# Patient Record
Sex: Female | Born: 1980 | Race: Black or African American | Hispanic: No | Marital: Single
Health system: Southern US, Community
[De-identification: ages and names within clinical notes are randomized; demographics above are authoritative.]

---

## 2009-06-20 ENCOUNTER — Emergency Department (HOSPITAL_COMMUNITY): Admission: EM | Admit: 2009-06-20 | Discharge: 2009-06-20 | Payer: Self-pay | Admitting: Emergency Medicine

## 2010-11-08 ENCOUNTER — Emergency Department (HOSPITAL_COMMUNITY)
Admission: EM | Admit: 2010-11-08 | Discharge: 2010-11-08 | Disposition: A | Discharge status: Home / Self Care | Payer: Self-pay | Attending: Emergency Medicine | Admitting: Emergency Medicine

## 2010-11-08 DIAGNOSIS — R05 Cough: Secondary | ICD-10-CM | POA: Insufficient documentation

## 2010-11-08 DIAGNOSIS — H9209 Otalgia, unspecified ear: Secondary | ICD-10-CM | POA: Insufficient documentation

## 2010-11-08 DIAGNOSIS — R059 Cough, unspecified: Secondary | ICD-10-CM | POA: Insufficient documentation

## 2010-11-08 DIAGNOSIS — J3489 Other specified disorders of nose and nasal sinuses: Secondary | ICD-10-CM | POA: Insufficient documentation

## 2010-11-08 DIAGNOSIS — F17200 Nicotine dependence, unspecified, uncomplicated: Secondary | ICD-10-CM | POA: Insufficient documentation

## 2010-11-08 DIAGNOSIS — R6883 Chills (without fever): Secondary | ICD-10-CM | POA: Insufficient documentation

## 2010-11-08 DIAGNOSIS — J069 Acute upper respiratory infection, unspecified: Secondary | ICD-10-CM | POA: Insufficient documentation

## 2010-11-08 DIAGNOSIS — J029 Acute pharyngitis, unspecified: Secondary | ICD-10-CM | POA: Insufficient documentation

## 2011-08-04 ENCOUNTER — Telehealth: Payer: Self-pay

## 2011-08-04 NOTE — Telephone Encounter (Signed)
Patient advised labs being pulled and we will call her when they are reviewed. Have you gotten them, her chart not in file. It is WC, I think maybe a culture.

## 2011-08-04 NOTE — Telephone Encounter (Signed)
Pt is calling to speak with chelle regarding lab results Please call pt at [phone]

## 2011-08-04 NOTE — Telephone Encounter (Signed)
Called patient, she was seen on 07/31/11 will pull chart, not in file, she is asking for results.

## 2011-08-04 NOTE — Telephone Encounter (Signed)
PT STATES SHE WAS SEEN FOR A W/C WITH CHELLE AND WOULD LIKE A CALL BACK REGARDING HER W/C VISIT PLEASE CALL [phone]

## 2011-08-05 NOTE — Telephone Encounter (Signed)
Patient was notified that the source patient tests were all negative yesterday by Benny Lennert, PA-C.  Because this is WC, it shouldn't be in the EMR-the OV is in the paper record.

## 2011-11-17 ENCOUNTER — Emergency Department (HOSPITAL_COMMUNITY)
Admission: EM | Admit: 2011-11-17 | Discharge: 2011-11-17 | Disposition: A | Discharge status: Home / Self Care | Payer: Self-pay | Attending: Emergency Medicine | Admitting: Emergency Medicine

## 2011-11-17 ENCOUNTER — Encounter (HOSPITAL_COMMUNITY): Payer: Self-pay | Admitting: *Deleted

## 2011-11-17 DIAGNOSIS — R51 Headache: Secondary | ICD-10-CM | POA: Insufficient documentation

## 2011-11-17 DIAGNOSIS — K0889 Other specified disorders of teeth and supporting structures: Secondary | ICD-10-CM

## 2011-11-17 DIAGNOSIS — K089 Disorder of teeth and supporting structures, unspecified: Secondary | ICD-10-CM | POA: Insufficient documentation

## 2011-11-17 DIAGNOSIS — H9209 Otalgia, unspecified ear: Secondary | ICD-10-CM | POA: Insufficient documentation

## 2011-11-17 MED ORDER — PENICILLIN V POTASSIUM 500 MG PO TABS: 500.0000 mg | ORAL_TABLET | Freq: Four times a day (QID) | ORAL | Status: AC

## 2011-11-17 MED ORDER — HYDROCODONE-ACETAMINOPHEN 5-500 MG PO TABS: 1.0000 | ORAL_TABLET | Freq: Four times a day (QID) | ORAL | Status: DC | PRN

## 2011-11-17 NOTE — ED Provider Notes (Signed)
History     CSN: [id number]  Arrival date & time 11/17/11  4098   First MD Initiated Contact with Patient 11/17/11 [phone]      No chief complaint on file.   (Consider location/radiation/quality/duration/timing/severity/associated sxs/prior treatment) HPI Comments: Roberta Chambers is a 31 y.o. Female who presents with complaint of a toothache. Pt states she first noted a tooth pain almost a year ago after her filling came out. States at that time, pain comes and goes. States last week, pain worsened and now constant. States swishing cool water and tramadol makes pain better, eating makes pain worse. Pt denies fever, chills, malaise. Does not have a dentist. Tried bc powder, tramadol, Listerine mouth wash with no relief.     History reviewed. No pertinent past medical history.  History reviewed. No pertinent past surgical history.  No family history on file.  History  Substance Use Topics  . Smoking status: Not on file  . Smokeless tobacco: Not on file  . Alcohol Use: Not on file    OB History    Grav Para Term Preterm Abortions TAB SAB Ect Mult Living                  Review of Systems  Constitutional: Negative for fever and chills.  HENT: Positive for ear pain and dental problem. Negative for sore throat and facial swelling.   Respiratory: Negative.   Cardiovascular: Negative.   Skin: Negative for rash.  Neurological: Positive for headaches.    Allergies  Review of patient's allergies indicates no known allergies.  Home Medications   Current Outpatient Rx  Name  Route  Sig  Dispense  Refill  . BC HEADACHE PO   Oral   Take 1 packet by mouth every 8 (eight) hours as needed. For pain         . HYDROGEN PEROXIDE 1.5 % MT SOLN   Topical   Apply 1 spray topically as needed. For pain         . ADULT MULTIVITAMIN W/MINERALS CH   Oral   Take 1 tablet by mouth daily.         . SERTRALINE HCL 50 MG PO TABS   Oral   Take 50 mg by mouth daily as needed. For  depression         . TRAMADOL HCL 50 MG PO TABS   Oral   Take 50 mg by mouth every 6 (six) hours as needed. For pain           There were no vitals taken for this visit.  Physical Exam  Nursing note and vitals reviewed. Constitutional: She appears well-developed and well-nourished. No distress.  HENT:       No facial swelling. External ear, ear canal, and TMs normal bilaterally. Normal pharynx. There is a carries in the right upper 1st bicuspid tooth. No surrounding erythema or gum swelling. No obvious abscess. Tooth tender to palpation.   Lymphadenopathy:    She has no cervical adenopathy.  Neurological: She is alert.  Skin: Skin is warm and dry.    ED Course  Procedures (including critical care time)  Pt with dental carries, dental pain. Will cover for possible early abscess with antibiotic. Pain medication. Follow up with a dentist. Pt otherwise non toxic. No facial swelling.    1. Pain, dental       MDM          Lottie Mussel, PA 11/17/11 1532

## 2011-11-17 NOTE — ED Notes (Addendum)
Per Pt:  Pt complaining of a severe tooth ache for about 1 week.  Pt st's she's unable to eat without considerable pain, drinking water seems to help the pain.  Pt st's the right side of her face is swollen and painful.  Pt st's she was given a few antibiotics (doesnt' remember which ones) by a friend and st's they helped for a little bit.

## 2011-11-18 NOTE — ED Provider Notes (Signed)
Medical screening examination/treatment/procedure(s) were performed by non-physician practitioner and as supervising physician I was immediately available for consultation/collaboration.   Shelda Jakes, MD 11/18/11 [phone]

## 2015-08-07 DIAGNOSIS — G8929 Other chronic pain: Secondary | ICD-10-CM | POA: Insufficient documentation

## 2015-08-07 DIAGNOSIS — F419 Anxiety disorder, unspecified: Secondary | ICD-10-CM | POA: Insufficient documentation

## 2015-08-07 DIAGNOSIS — F331 Major depressive disorder, recurrent, moderate: Secondary | ICD-10-CM | POA: Insufficient documentation

## 2015-08-07 DIAGNOSIS — M25561 Pain in right knee: Secondary | ICD-10-CM

## 2016-02-19 ENCOUNTER — Other Ambulatory Visit: Payer: Self-pay | Admitting: Orthopedic Surgery

## 2016-02-19 DIAGNOSIS — M25561 Pain in right knee: Secondary | ICD-10-CM

## 2016-02-24 ENCOUNTER — Ambulatory Visit
Admission: RE | Admit: 2016-02-24 | Discharge: 2016-02-24 | Disposition: A | Discharge status: Home / Self Care | Payer: BLUE CROSS/BLUE SHIELD | Source: Ambulatory Visit | Attending: Orthopedic Surgery | Admitting: Orthopedic Surgery

## 2016-02-24 DIAGNOSIS — M25561 Pain in right knee: Secondary | ICD-10-CM

## 2016-09-25 ENCOUNTER — Encounter (INDEPENDENT_AMBULATORY_CARE_PROVIDER_SITE_OTHER): Payer: BLUE CROSS/BLUE SHIELD | Admitting: Podiatry

## 2016-09-25 ENCOUNTER — Ambulatory Visit: Payer: BLUE CROSS/BLUE SHIELD

## 2016-09-25 DIAGNOSIS — M79672 Pain in left foot: Secondary | ICD-10-CM

## 2016-09-25 DIAGNOSIS — M79671 Pain in right foot: Secondary | ICD-10-CM

## 2016-09-25 NOTE — Progress Notes (Signed)
This encounter was created in error - please disregard.

## 2017-09-03 IMAGING — MR MR KNEE*R* W/O CM
5 series · 33 of 40 positions shown · non-contrast
Comparison: None.

CLINICAL DATA: 35-year-old with chronic knee pain for 3 years. No
acute injury or prior relevant surgery. Steroid injection 4 or 5
days prior.

EXAM:
MRI OF THE RIGHT KNEE WITHOUT CONTRAST
TECHNIQUE: Multiplanar, multisequence MR imaging of the knee was performed. No
intravenous contrast was administered.

[Series 9: PD fat-sat · axial · right · 3.0mm · 0.39mm/px · z∈[+6,+117]mm · 8 of 32 slices shown (1 of 3)]
[im 1/32]
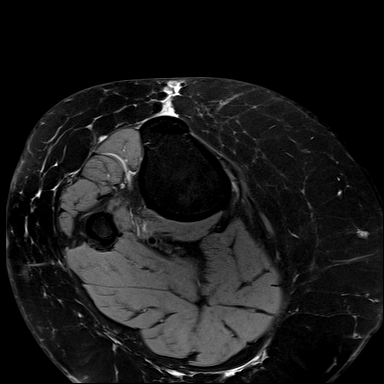
[im 4/32]
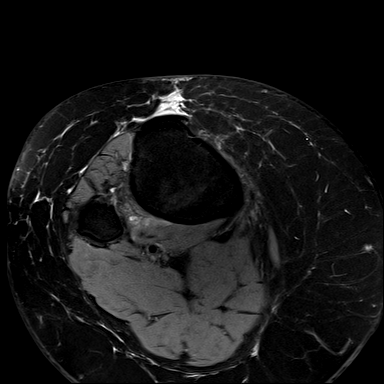
[im 11/32]
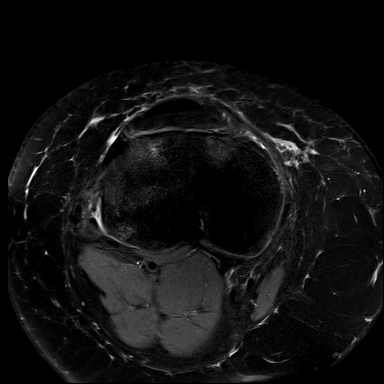
[im 14/32]
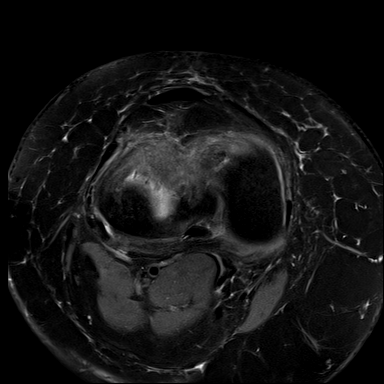
[im 18/32]
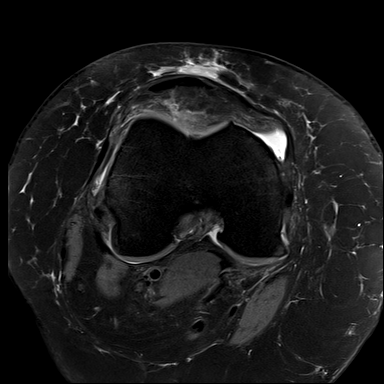
[im 21/32]
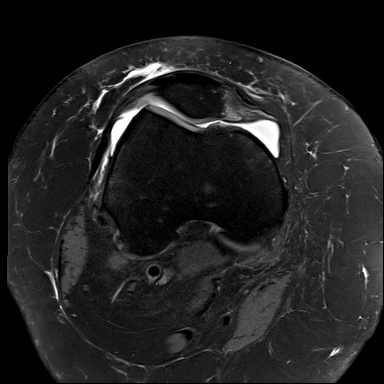
[im 28/32]
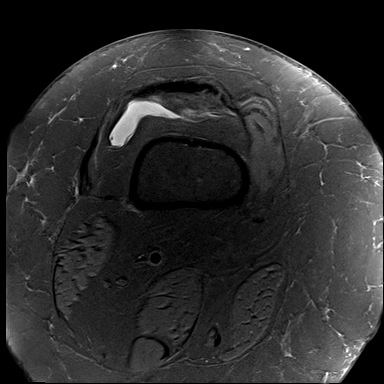
[im 32/32]
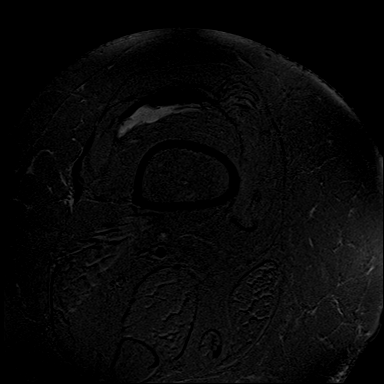

[Series 11: PD fat-sat · coronal · right · 3.5mm · 0.39mm/px · 7 of 20 slices shown (2 of 3)]
[im 1/20]
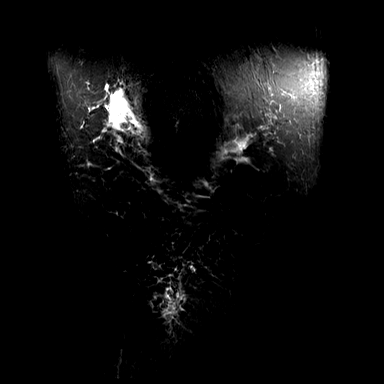
[im 4/20]
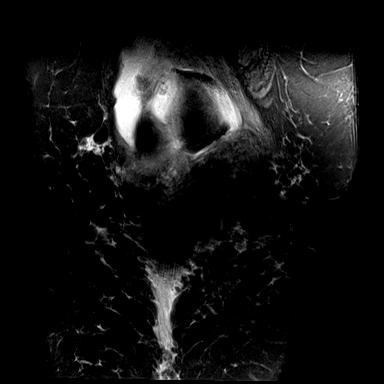
[im 7/20]
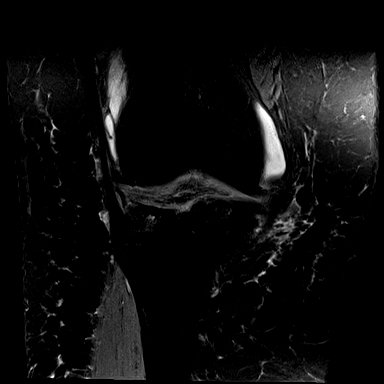
[im 10/20]
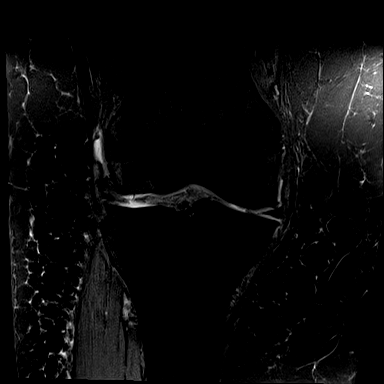
[im 13/20]
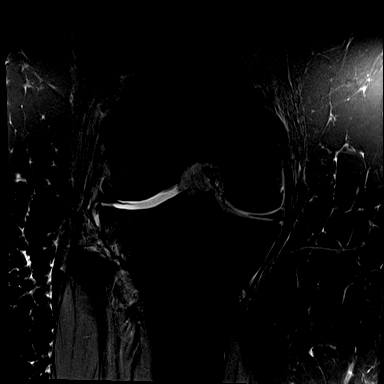
[im 16/20]
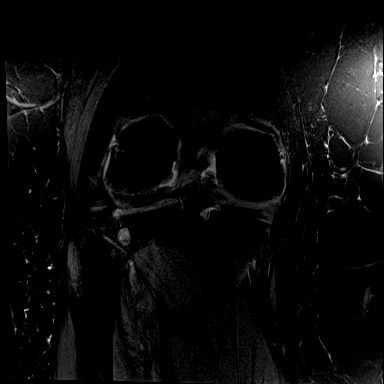
[im 20/20]
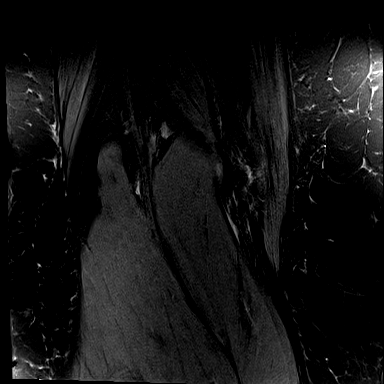

[Series 12: t1_tse_cor · coronal · right · 3.5mm · 0.39mm/px · 2 of 20 slices shown]
[im 1/20]
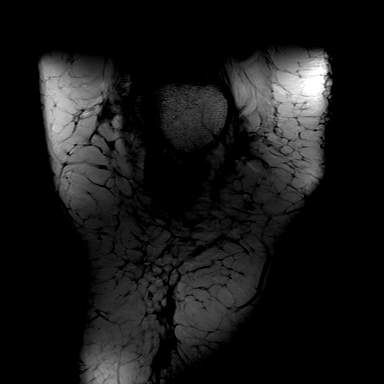
[im 4/20]
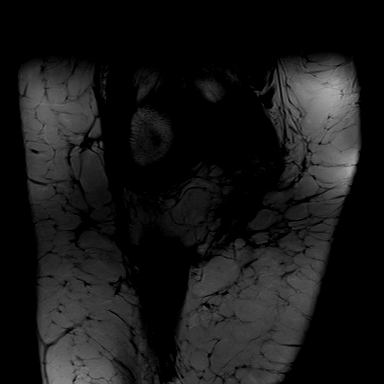

[Series 13: T2 fat-sat · coronal · right · 3.5mm · 0.39mm/px · 7 of 20 slices shown]
[im 1/20]
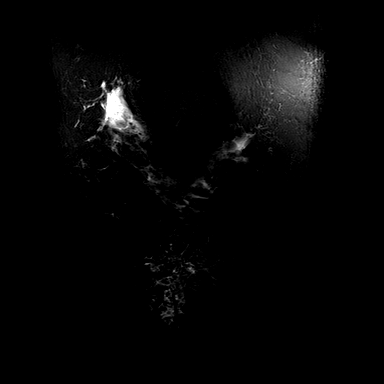
[im 4/20]
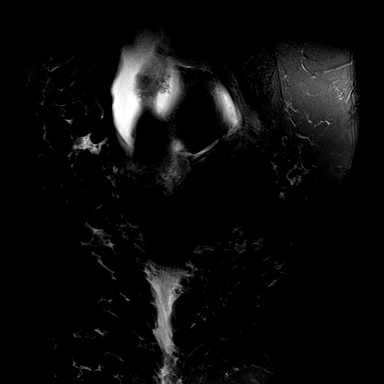
[im 7/20]
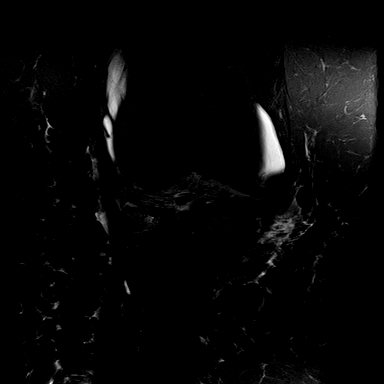
[im 10/20]
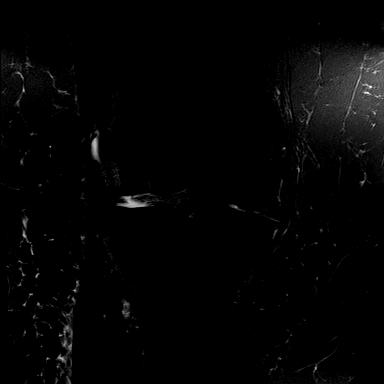
[im 13/20]
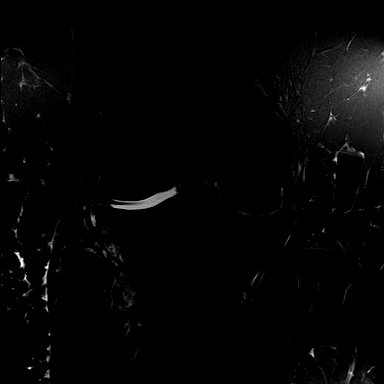
[im 16/20]
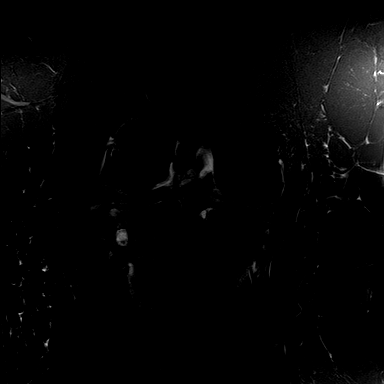
[im 20/20]
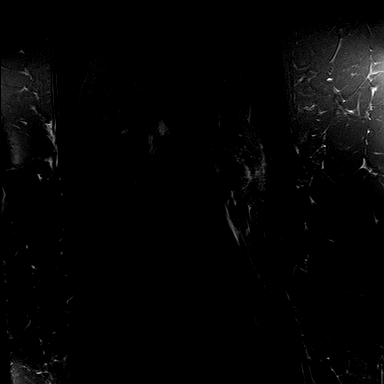

[Series 14: PD fat-sat · sagittal · right · 3.0mm · 0.39mm/px · 9 of 26 slices shown (3 of 3)]
[im 1/26]
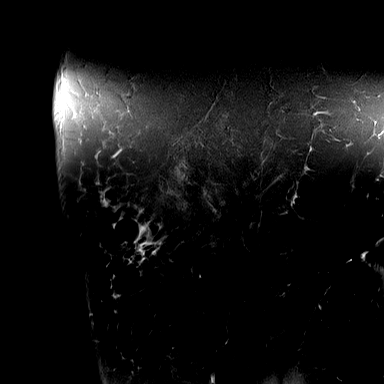
[im 4/26]
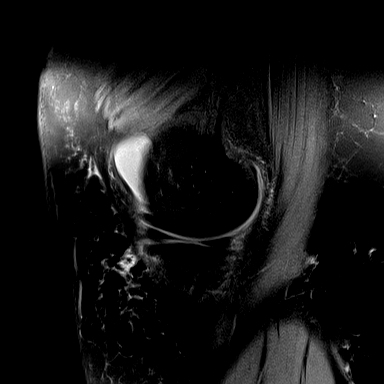
[im 7/26]
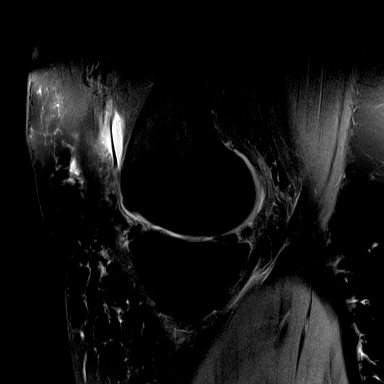
[im 10/26]
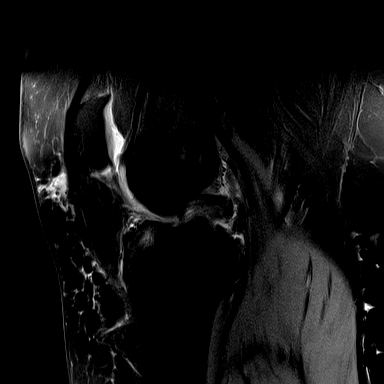
[im 13/26]
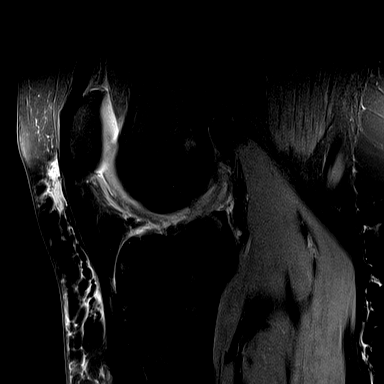
[im 16/26]
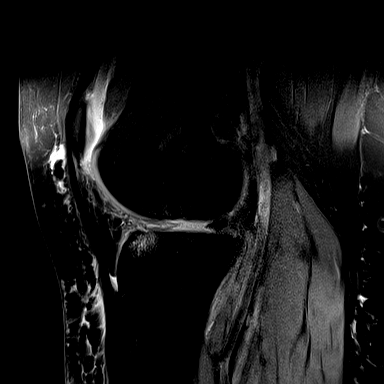
[im 19/26]
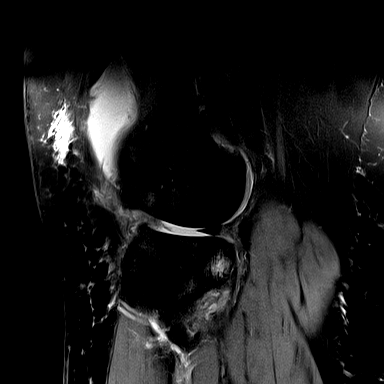
[im 22/26]
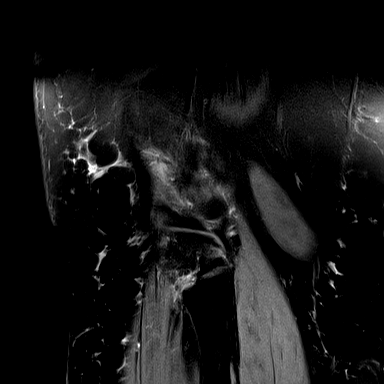
[im 26/26]
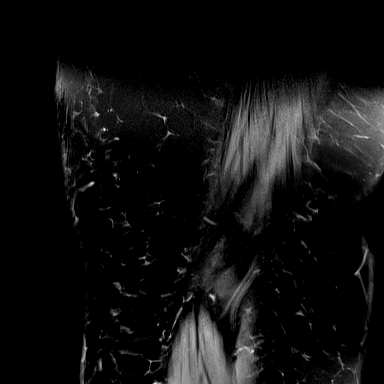

[33 of 40 positions shown; findings below may reference images not displayed]

FINDINGS: MENISCI

Medial meniscus: Intact with normal morphology. There is a amorphous
T2 hypointensity posterior to the posterior horn, best seen on the
axial and sagittal images which may reflect focal synovitis.

Lateral meniscus: Diffusely degenerated with irregular degenerative
tearing of the anterior horn and body. No displaced meniscal
fragment.

LIGAMENTS

Cruciates:  Intact.  There is moderate ACL mucoid degeneration.

Collaterals:  Intact.

CARTILAGE

Patellofemoral: The patellar cartilage is well preserved. There is
mild lateral trochlear chondromalacia.

Medial: Mild chondral thinning and surface irregularity. There are
small marginal osteophytes and mild subchondral cyst formation
anteriorly in the tibial plateau.

Lateral: Age advanced chondral thinning with osteophytes and
subchondral cyst formation.

MISCELLANEOUS

Joint: Moderate-sized joint effusion. As above, possible focal
synovitis posterior to the posterior horn of the medial meniscus. No
other loose bodies observed.

Popliteal Fossa:  Unremarkable. No significant Baker's cyst.

Extensor Mechanism:  Intact.

Bones: No acute osseous findings are seen. There are degenerative
changes of the proximal tibiofibular articulation with a subchondral
cyst or intraosseous ganglion posterolaterally in the tibial
plateau.

Other: Mild prepatellar subcutaneous edema and a small amount of
fluid in the deep infrapatellar bursa are noted. There is a mildly
prominent medial patellar plica. There is mild edema in Hoffa's fat.
IMPRESSION: 1. Age advanced lateral compartment degenerative changes with
degenerative tearing of the anterior horn and body of the lateral
meniscus.
2. Moderate ACL mucoid degeneration.
3. The medial meniscus and collateral ligaments are intact.
4. Joint effusion with probable synovitis. Possible focal synovitis
posterior to the posterior horn of the medial meniscus.

## 2020-04-05 ENCOUNTER — Other Ambulatory Visit: Payer: Self-pay | Admitting: Family Medicine

## 2020-04-05 DIAGNOSIS — N6452 Nipple discharge: Secondary | ICD-10-CM

## 2020-04-05 DIAGNOSIS — N644 Mastodynia: Secondary | ICD-10-CM

## 2020-04-25 ENCOUNTER — Other Ambulatory Visit: Payer: BLUE CROSS/BLUE SHIELD

## 2020-06-08 ENCOUNTER — Other Ambulatory Visit: Payer: Self-pay

## 2022-01-23 ENCOUNTER — Encounter: Payer: Self-pay | Admitting: Obstetrics and Gynecology

## 2022-02-25 ENCOUNTER — Encounter: Payer: Self-pay | Admitting: Obstetrics and Gynecology

## 2022-05-08 ENCOUNTER — Encounter: Payer: Self-pay | Admitting: Obstetrics and Gynecology

## 2022-06-23 ENCOUNTER — Encounter: Payer: Self-pay | Admitting: Obstetrics and Gynecology
